# Patient Record
Sex: Male | Born: 2005 | Race: White | Hispanic: No | Marital: Single | Smoking: Never smoker
Health system: Southern US, Community
[De-identification: ages and names within clinical notes are randomized; demographics above are authoritative.]

## PROBLEM LIST (undated history)

## (undated) HISTORY — PX: ADENOIDECTOMY AND MYRINGOTOMY WITH TUBE PLACEMENT: SHX5714

---
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
## 2024-01-21 ENCOUNTER — Other Ambulatory Visit (HOSPITAL_BASED_OUTPATIENT_CLINIC_OR_DEPARTMENT_OTHER): Payer: Self-pay

## 2024-01-21 MED ORDER — AMOXICILLIN 500 MG PO CAPS
500.0000 mg | ORAL_CAPSULE | Freq: Three times a day (TID) | ORAL | 0 refills | Status: AC
  Filled 2024-01-21: qty 12, 4d supply, fill #0

## 2024-01-21 MED ORDER — HYDROCODONE-ACETAMINOPHEN 5-325 MG PO TABS
1.0000 | ORAL_TABLET | ORAL | 0 refills | Status: AC
  Filled 2024-01-21: qty 5, 1d supply, fill #0

## 2024-01-23 ENCOUNTER — Other Ambulatory Visit (HOSPITAL_BASED_OUTPATIENT_CLINIC_OR_DEPARTMENT_OTHER): Payer: Self-pay

## 2024-01-31 ENCOUNTER — Other Ambulatory Visit (HOSPITAL_BASED_OUTPATIENT_CLINIC_OR_DEPARTMENT_OTHER): Payer: Self-pay

## 2024-02-21 ENCOUNTER — Other Ambulatory Visit (HOSPITAL_BASED_OUTPATIENT_CLINIC_OR_DEPARTMENT_OTHER): Payer: Self-pay
